# Patient Record
Sex: Male | Born: 1975 | Race: Black or African American | Hispanic: No | Marital: Married | Smoking: Never smoker
Health system: Southern US, Community
[De-identification: ages and names within clinical notes are randomized; demographics above are authoritative.]

## PROBLEM LIST (undated history)

## (undated) DIAGNOSIS — W19XXXA Unspecified fall, initial encounter: Secondary | ICD-10-CM

## (undated) DIAGNOSIS — S2239XA Fracture of one rib, unspecified side, initial encounter for closed fracture: Secondary | ICD-10-CM

---

## 2006-08-26 DIAGNOSIS — S2239XA Fracture of one rib, unspecified side, initial encounter for closed fracture: Secondary | ICD-10-CM

## 2006-08-26 DIAGNOSIS — W19XXXA Unspecified fall, initial encounter: Secondary | ICD-10-CM

## 2006-08-26 HISTORY — DX: Fracture of one rib, unspecified side, initial encounter for closed fracture: S22.39XA

## 2006-08-26 HISTORY — DX: Unspecified fall, initial encounter: W19.XXXA

## 2006-09-27 ENCOUNTER — Emergency Department (HOSPITAL_COMMUNITY): Admission: EM | Admit: 2006-09-27 | Discharge: 2006-09-27 | Payer: Self-pay

## 2007-04-22 ENCOUNTER — Ambulatory Visit (HOSPITAL_COMMUNITY): Payer: Self-pay | Admitting: Psychiatry

## 2007-11-27 ENCOUNTER — Emergency Department (HOSPITAL_COMMUNITY): Admission: EM | Admit: 2007-11-27 | Discharge: 2007-11-28 | Payer: Self-pay | Admitting: Emergency Medicine

## 2008-02-29 ENCOUNTER — Emergency Department (HOSPITAL_COMMUNITY): Admission: EM | Admit: 2008-02-29 | Discharge: 2008-02-29 | Payer: Self-pay | Admitting: Emergency Medicine

## 2008-04-29 ENCOUNTER — Encounter: Admission: RE | Admit: 2008-04-29 | Discharge: 2008-07-28 | Payer: Self-pay | Admitting: Anesthesiology

## 2008-05-03 ENCOUNTER — Ambulatory Visit: Payer: Self-pay | Admitting: Anesthesiology

## 2008-05-17 ENCOUNTER — Ambulatory Visit: Payer: Self-pay | Admitting: Anesthesiology

## 2008-05-30 ENCOUNTER — Ambulatory Visit (HOSPITAL_COMMUNITY): Admission: RE | Admit: 2008-05-30 | Discharge: 2008-05-30 | Payer: Self-pay | Admitting: Family Medicine

## 2008-07-12 ENCOUNTER — Ambulatory Visit: Payer: Self-pay | Admitting: Anesthesiology

## 2008-08-08 ENCOUNTER — Encounter: Admission: RE | Admit: 2008-08-08 | Discharge: 2008-09-06 | Payer: Self-pay | Admitting: Anesthesiology

## 2008-08-09 ENCOUNTER — Ambulatory Visit: Payer: Self-pay | Admitting: Anesthesiology

## 2008-09-06 ENCOUNTER — Ambulatory Visit: Payer: Self-pay | Admitting: Anesthesiology

## 2011-01-08 NOTE — Procedures (Signed)
[name], [name]                 ACCOUNT NO.:  [id number]   MEDICAL RECORD NO.:  [id number]          PATIENT TYPE:  REC   LOCATION:  [facility]                          FACILITY:  [facility]   PHYSICIAN:  Celene Kras, MD        DATE OF BIRTH:  November 20, 1975   DATE OF PROCEDURE:  DATE OF DISCHARGE:                               OPERATIVE REPORT   B[facility] [name] [facility].  I evaluated and  reviewed the Health and History form and 14-point review of systems.   1. Bryan [name] has a mixed spondylitic SI pain, and I suspected      contributory innervation from the 5 and 4 facet, at the medial      branch.  It is reasonable to block the SI joint and the facet, see      how he does.  Contributory elements from 3 will also be addressed.  2. This will be to the right side.  We will assess within the context      of activities of daily living.  We will consider this a 2-region or      2-separate entities of the SI and facet medial branch, but both I      think would be amenable to RF.  On exam, historical features      suggested  pain generators are supported at both levels.  I do not      necessarily plan another intervention.  I want to see how he does.      Risks, complications, and options are fully outlined.   Objectively, diffuse paralumbar myofascial, Fortin test positive,  modified gains using Patrick positive, particularly right greater than  left.  Pain with extension.  He has range of motion impaired secondary  to pain, spondylitic, sacral joint in nature.   IMPRESSION:  Sacral joint pain facet syndrome.   PLAN:  1. Sacral joint intervention, inferior medial margin 5, 4, 3 facet      right side, under local anesthetic, and he is consented.  Follow up      with him in 1 month and determine further course of care.  2. I recommend weight control, home based therapy.  He states he is      joining a gym that should be encouraged.  3. Follow expectantly.  I have used  models and discussed in lay terms.      He has consented.   The patient was taken to the fluoroscopy suite and was placed on prone  position.  The back prepped and draped in the usual fashion.  Using a 22-  gauge spinal needle, advanced the inferior medial margin of the right SI  joint and injected 1.5% mL lidocaine or Marcaine 0.5% MPF, 10 mg of  Aristocort.   Taking care, I then addressed the facet at the medial branch of 5, 4,  and 3, independent needle access points, right side, under local  anesthetic and confirmed placement.  I then injected 1.5 % Marcaine, 1%  MPF at each level, with a total of 30  mg Aristocort in divided dose.   He tolerated the procedure well.  No complications from our procedure.  Appropriate recovery.  Discharge instructions given.  No barrier to  communication and he was improved at discharge and we will see how he  does in a month.           ______________________________  Celene Kras, MD     HH/MEDQ  D:  07/12/2008 11:52:03  T:  07/13/2008 01:10:46  Job:  [id number]

## 2011-01-08 NOTE — Assessment & Plan Note (Signed)
B[name].  I evaluated him  and reviewed the health and history form and 14-point review of systems.   1. Bryan [name] comes to [place] today.  He has a follow up to the facet      intervention, and I discussed treatment limitations and options.      He continues to exercise good at the gym, I encouraged his weight      control for best outcome.  2. He catastrophizes, and has quite a bit of negative discussion as to      seeing himself as having a number of medical issues.  I reassured      him and continued to enable him as I do think he will do well, age      is certainly on a side.  He has spondylitic pain, and I think  that      will respond to RF as he has met bench marks, with greater than 75-      85% relief cycling.  3. Again, I encouraged him as the best outcome.  He is doing what he      can here, I anticipate he will do well.  He certainly is not moving      towards the disabling position, and I do think RF will help enable      him.  4. I do not see any clear depressive indices, but we will follow that      as well.  5. We will be emphasizing nonnarcotic medications alternatives.  Do      not believe further imaging or diagnostics are warranted.   Objectively, diffuse paralumbar myofascial discomfort.  Fortin test  positive.  Side bending.  Pain with extension.  Range of motion impaired  secondary to pain, typical spondylotic pain, mostly myofascial and  mechanical.  Nothing new neurologically.   IMPRESSION:  Spondylolysis and degenerative spine disease, lumbar spine.   PLAN:  As outlined above, RF and followup.  I have used models to  discuss in lay terms.  I have discussed treatment limitations, options,  risks, and expectations.           ______________________________  Celene Kras, MD     HH/MedQ  D:  08/09/2008 11:05:13  T:  08/10/2008 05:35:08  Job #:  [id number]

## 2011-01-08 NOTE — Procedures (Signed)
NAMETIBERIUS, LOFTUS                 ACCOUNT NO.:  [id number]   MEDICAL RECORD NO.:  [id number]          PATIENT TYPE:  OUT   LOCATION:  [facility]                          FACILITY:  [facility]   PHYSICIAN:  Celene Kras, MD        DATE OF BIRTH:  09-14-1975   DATE OF PROCEDURE:  DATE OF DISCHARGE:  05/30/2008                               OPERATIVE REPORT   Emidio Warrell comes into [facility] today.  I evaluated  her, reviewed history, and performed 14-point review of systems.  Some  benefit from intervention, we    DICTATION CANCELED.           ______________________________  Celene Kras, MD     HH/MEDQ  D:  07/12/2008 12:57:17  T:  07/13/2008 05:00:49  Job:  [id number]

## 2011-01-08 NOTE — Procedures (Signed)
Bryan [name], Bryan [name]                 ACCOUNT NO.:  [id number]   MEDICAL RECORD NO.:  [id number]          PATIENT TYPE:  REC   LOCATION:  [facility]                          FACILITY:  [facility]   PHYSICIAN:  Celene Kras, MD        DATE OF BIRTH:  May 19, 1976   DATE OF PROCEDURE:  05/17/2008  DATE OF DISCHARGE:                               OPERATIVE REPORT   Adelard Sanon comes to [facility] today for scheduled facet  intervention.  1. Considered adding the SI, but I am going to isolate the facet as RF      could be an option for [place].  I discussed benchmark, I consider      second intervention if warranted.  We noted the response of the SI,      I think the facet as contributory as the spondylitic pain.  I      planned 5, 4, 3, and 2 with contributory innervation addressed.      Questions are answered and discussed in lay terms.  2. Consider second sequential intervention, looking for benchmark of      50-75%.  3. Right side most problematic side.  I do not think we need to go to      the left.  He is consented for today's procedure.   The patient was taken to fluoroscopy suite, placed in prone position,  the back prepped and draped in usual fashion.   Objectively, exam interval not significantly changed.  He has diffused  paralumbar myofascial, Fortin test positive.  Pain with extension.  Nothing new neurological.   IMPRESSION:  Spondylosis, __________  lumbar spine, sacral joint pain.   PLAN:  Facet medial branch intervention 5, 4, 3, and 2, he has  consented.   The patient taken to fluoroscopy suite and placed in the prone position,  back prepped and draped in usual fashion.  Using a 22-gauge spinal  needle, I advanced the facet at the medial branch 5, 4, 3, 2, right side  independent needle access points under local anesthetic.  Confirmed  placement.  I then injected 1 mL of lidocaine, 1% MPF each level with a  total of 40 mg of Aristocort in divided doses.   Tolerated  procedure well.  No complications from our procedure.  See him  in followup.           ______________________________  Celene Kras, MD     HH/MEDQ  D:  05/17/2008 14:50:34  T:  05/18/2008 02:49:29  Job:  [id number]

## 2011-01-08 NOTE — Procedures (Signed)
NAMEINDIE, NICKERSON                 ACCOUNT NO.:  [id number]   MEDICAL RECORD NO.:  [id number]          PATIENT TYPE:  REC   LOCATION:  [facility]                          FACILITY:  [facility]   PHYSICIAN:  Celene Kras, MD        DATE OF BIRTH:  11/08/1975   DATE OF PROCEDURE:  DATE OF DISCHARGE:                               OPERATIVE REPORT   Gavyn [facility].  I evaluated and  reviewed the Health and History form and 14-point review of systems.   1. He has demonstrated a positive predictive experience and we are      going to go onto RF, after exhibiting benchmark response,      sequential intervention, describing most problematic facets S5 and      S4.  This is a right side.  He does have some SI overlay, possibly      some of the superior segments could be problematic, but I think      going I am going to focus on 5 and 4 today, with 3 as contributory      innervation.  2. Other modifiable features in health profile discussed.  Home based      therapy.  3. Weight control.  Reviewed this procedure.  I have discussed      treatment limitations and options, expectations, and reviewed with      him.   Objectively, no significant interval change, diffuse paralumbar  myofascial discomfort.  Fortin test is positive with side bending.  Pain  with extension.  Nothing new neurologically.   IMPRESSION:  Degenerative spine disease, lumbar spine with spondylosis,  and minimal myelopathy.   PLAN:  Facet medial branch intervention, radiofrequency neural ablation,  10-mm active tip and he is consented.  5, 4, and 3 right side.   The patient was taken to the fluoroscopy suite and placed in prone  position.  The back was prepped and draped in the usual fashion.  Using  a 22-gauge, 10-mm active tip needle, we advanced the facet at the medial  branch at 5, 4, and 3, confirmed placement in multiple fluoroscopic  positions.  Stimulate, follow with confirmation, 1 mL of  lidocaine,  Marcaine 0.25% MPF and 40 mg Aristocort in divided dose, to the facet.  Lesioning performed at 60 degrees for 60 seconds.  He tolerated the  procedure well.  No complications from our procedure.  Appropriate  recovery.  Improved at discharge.  Assessed in context of activities of  daily living.  Follow up in 1 month.           ______________________________  Celene Kras, MD     HH/MEDQ  D:  09/06/2008 09:35:45  T:  09/07/2008 00:18:35  Job:  [id number]

## 2011-01-08 NOTE — Procedures (Signed)
Bryan [name], Bryan [name]                 ACCOUNT NO.:  [id number]   MEDICAL RECORD NO.:  [id number]           PATIENT TYPE:   LOCATION:                                 FACILITY:   PHYSICIAN:  Celene Kras, MD             DATE OF BIRTH:   DATE OF PROCEDURE:  DATE OF DISCHARGE:                               OPERATIVE REPORT   [facility] for evaluation  and management, referred to [place] from Dr. Lavada Mesi.  A pleasant  individual, 35 years old, he has his young daughter with him.  He fell,  this was off a ladder sustaining injury to his low back, with some right  leg pain.  He is describing more of a radicular pain, some spondylitic  pain is evident, and according to the record Dr. Murray Hodgkins had considered  going forward with SI joint intervention.  That intervention would have  consisted of RF as he apparently has demonstrated about 50% diminution  of pain perception after block, but states incomplete down the leg.  He  has also been followed by primary care for hypertension within the  group, and he is seen at [facility] where he is taking  Percocet, and he wants to get off that.  Relating his pain is 7/10 on a  subjective scale.  No bowel or bladder disorder, overall neurological  deficit, worse in the morning.  Walking, bending, sitting, standing, and  some activities make it worse, improved at the rest, heat, medication,  and injections.  He believes that the pain is sometimes aggravated to  the point where he has to take to the bed, and I counsel.  He is able to  walk without assistance, and he has a good endurance potential.  He is  not working, has not worked since September 26, 2006, secondary to pain.  He has difficulty with meal prep household activities.   Nonsmoker, nondrinker, denies chemical or alcohol dependency.  He does  have some modest situational anxiety, but states no wish to harm himself  or others.   PAST MEDICAL  HISTORY:  Remarkable for hypertension, GERD.   FAMILY HISTORY:  Remarkable for heart disease, lung disease, diabetes,  hypertension.   He is married, he has a young daughter.   Review of systems, family and social history are otherwise  noncontributory to the pain problem.   PHYSICAL EXAMINATION:  GENERAL:  A pleasant, obese male sitting  comfortably in bed.  Gait:  Slight limp.  Affect, appearance normal,  oriented x3.  HEENT:  Otherwise unremarkable.  CHEST:  Clear to auscultation and percussion with increased AP diameter.  A regular rate and rhythm without rub, murmur, or gallop.  ABDOMEN:  Soft, nontender, benign, obese without evidence of  hepatosplenomegaly, diffuse paralumbar myofascial discomfort, Fortin  test positive, modified [place] and Patrick positive, right and left,  right predominant.  Pain with extension has aggravated his infragluteal  pain, and a straight leg lift is unimpaired.  EHL intact.  IMPRESSION:  Workman's comp injury with a spondylitic pain sacral joint  pain, modest radicular pain, and uncharacteristic radicular pattern.   PLAN:  1. I would recommend possibly addressing the facet SI, with RF as an      option.  He states he has demonstrated a positive predictive      experience, but only had the SI block, and I am going to need to      get some more records as they are incomplete.  We will look into      that.  2. He wants to get off his pain med, and this is another rationale for      performing intervention intervention, positive predictive      experience, the least consideration radiofrequency neural ablation,      but he will need to hit benchmarks so that will be about 75-80%.      Improvement in function, range of motion, and decreased medication      usage parameters or other benchmark.  3. Weight control for best outcome.  4. Recommend conditioning.  I do not want him to adopt a sedentary or      discouraged lifestyle.  He has some  situational depression that may      need to be treated as well.  The record reflects that Effexor was      recommended and he does not list that medicine.  I asked him to      maintain contact with primary care in this regard.  Questions were      answered, discussed in lay terms.  We will see him in followup.            ______________________________  Celene Kras, MD     HH/MEDQ  D:  05/03/2008 12:12:26  T:  05/04/2008 01:14:09  Job:  [id number]   cc:   Lillia Carmel, M.D.  Fax: [phone]

## 2011-05-21 LAB — CBC
Hemoglobin: 14.9
MCV: 87.9
Platelets: 344
RBC: 4.78
RDW: 13.3

## 2011-05-21 LAB — BASIC METABOLIC PANEL
BUN: 11
CO2: 29
Chloride: 101
GFR calc non Af Amer: >60
Glucose, Bld: 91
Potassium: 4

## 2011-05-21 LAB — URINALYSIS, ROUTINE W REFLEX MICROSCOPIC
Protein, ur: NEGATIVE
Specific Gravity, Urine: 1.027
pH: 6.5

## 2011-05-21 LAB — DIFFERENTIAL
Basophils Absolute: 0
Basophils Relative: 0
Eosinophils Absolute: 0.2
Eosinophils Relative: 2
Lymphs Abs: 3.3
Monocytes Absolute: 0.8
Monocytes Relative: 7
Neutro Abs: 7.3
Neutrophils Relative %: 63

## 2012-01-17 ENCOUNTER — Encounter: Payer: Self-pay | Admitting: Family Medicine

## 2012-01-17 ENCOUNTER — Ambulatory Visit: Payer: Self-pay | Admitting: Family Medicine

## 2012-01-17 DIAGNOSIS — Z0289 Encounter for other administrative examinations: Secondary | ICD-10-CM

## 2012-01-17 DIAGNOSIS — Z0000 Encounter for general adult medical examination without abnormal findings: Secondary | ICD-10-CM
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
## 2013-01-15 ENCOUNTER — Ambulatory Visit: Payer: Self-pay | Admitting: Internal Medicine

## 2013-01-15 VITALS — BP 124/76 | HR 98 | Temp 98.5°F | Resp 17 | Ht 64.5 in | Wt 208.0 lb

## 2013-01-15 DIAGNOSIS — Z0289 Encounter for other administrative examinations: Secondary | ICD-10-CM

## 2013-01-15 NOTE — Progress Notes (Signed)
  Subjective:    Patient ID: Bryan [name], male    DOB: [birth year], 37 y.o.   MRN: [id number]  HPI Healthy, no problems, on no meds   Review of Systems  Constitutional: Negative.   HENT: Negative.   Eyes: Negative.   Respiratory: Negative.   Cardiovascular: Negative.   Gastrointestinal: Negative.   Endocrine: Negative.   Genitourinary: Negative.   Allergic/Immunologic: Negative.   Neurological: Negative.   Hematological: Negative.   Psychiatric/Behavioral: Negative.        Objective:   Physical Exam  Vitals reviewed. Constitutional: He is oriented to person, place, and time. He appears well-developed and well-nourished.  HENT:  Right Ear: External ear normal.  Left Ear: External ear normal.  Nose: Nose normal.  Mouth/Throat: Oropharynx is clear and moist.  Eyes: Conjunctivae are normal. Pupils are equal, round, and reactive to light.  Neck: Normal range of motion. Neck supple. No thyromegaly present.  Cardiovascular: Normal rate, regular rhythm and normal heart sounds.   Pulmonary/Chest: Effort normal and breath sounds normal.  Abdominal: Soft. Bowel sounds are normal. There is no tenderness.  Genitourinary: Penis normal.  Musculoskeletal: Normal range of motion. He exhibits no edema.  Lymphadenopathy:    He has no cervical adenopathy.  Neurological: He is alert and oriented to person, place, and time. He has normal reflexes. No cranial nerve deficit. He exhibits normal muscle tone. Coordination normal.  Skin: Skin is warm. No rash noted.  Psychiatric: He has a normal mood and affect. His behavior is normal. Thought content normal.          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

## 2013-03-13 ENCOUNTER — Encounter: Payer: Self-pay | Admitting: Internal Medicine

## 2016-01-08 ENCOUNTER — Encounter (HOSPITAL_COMMUNITY): Payer: Self-pay | Admitting: Family Medicine

## 2016-01-08 ENCOUNTER — Emergency Department (HOSPITAL_COMMUNITY)
Admission: EM | Admit: 2016-01-08 | Discharge: 2016-01-08 | Disposition: A | Discharge status: Home / Self Care | Payer: 59 | Attending: Emergency Medicine | Admitting: Emergency Medicine

## 2016-01-08 ENCOUNTER — Emergency Department (HOSPITAL_COMMUNITY): Payer: 59

## 2016-01-08 DIAGNOSIS — S199XXA Unspecified injury of neck, initial encounter: Secondary | ICD-10-CM | POA: Insufficient documentation

## 2016-01-08 DIAGNOSIS — S0990XA Unspecified injury of head, initial encounter: Secondary | ICD-10-CM | POA: Insufficient documentation

## 2016-01-08 DIAGNOSIS — Y92410 Unspecified street and highway as the place of occurrence of the external cause: Secondary | ICD-10-CM | POA: Diagnosis not present

## 2016-01-08 DIAGNOSIS — Y998 Other external cause status: Secondary | ICD-10-CM | POA: Diagnosis not present

## 2016-01-08 DIAGNOSIS — Y9389 Activity, other specified: Secondary | ICD-10-CM | POA: Insufficient documentation

## 2016-01-08 HISTORY — DX: Unspecified fall, initial encounter: W19.XXXA

## 2016-01-08 HISTORY — DX: Fracture of one rib, unspecified side, initial encounter for closed fracture: S22.39XA

## 2016-01-08 LAB — URINALYSIS, ROUTINE W REFLEX MICROSCOPIC
BILIRUBIN URINE: NEGATIVE
GLUCOSE, UA: NEGATIVE mg/dL
HGB URINE DIPSTICK: NEGATIVE
Ketones, ur: NEGATIVE mg/dL
Leukocytes, UA: NEGATIVE
Nitrite: NEGATIVE
PROTEIN: NEGATIVE mg/dL
Specific Gravity, Urine: 1.008 (ref 1.005–1.030)
pH: 7 (ref 5.0–8.0)

## 2016-01-08 LAB — CBC WITH DIFFERENTIAL/PLATELET
BASOS ABS: 0 10*3/uL (ref 0.0–0.1)
BASOS PCT: 0 %
Eosinophils Absolute: 0.1 10*3/uL (ref 0.0–0.7)
Eosinophils Relative: 1 %
HEMATOCRIT: 43.9 % (ref 39.0–52.0)
HEMOGLOBIN: 15.3 g/dL (ref 13.0–17.0)
Lymphocytes Relative: 19 %
Lymphs Abs: 1.7 10*3/uL (ref 0.7–4.0)
MCH: 31.9 pg (ref 26.0–34.0)
MCHC: 34.9 g/dL (ref 30.0–36.0)
MCV: 91.6 fL (ref 78.0–100.0)
MONOS PCT: 8 %
Monocytes Absolute: 0.7 10*3/uL (ref 0.1–1.0)
NEUTROS PCT: 72 %
Neutro Abs: 6.1 10*3/uL (ref 1.7–7.7)
Platelets: 279 10*3/uL (ref 150–400)
RBC: 4.79 MIL/uL (ref 4.22–5.81)
RDW: 12.5 % (ref 11.5–15.5)
WBC: 8.5 10*3/uL (ref 4.0–10.5)

## 2016-01-08 LAB — RAPID URINE DRUG SCREEN, HOSP PERFORMED
AMPHETAMINES: NOT DETECTED
BARBITURATES: NOT DETECTED
BENZODIAZEPINES: NOT DETECTED
COCAINE: NOT DETECTED
Opiates: NOT DETECTED
TETRAHYDROCANNABINOL: NOT DETECTED

## 2016-01-08 LAB — ETHANOL: Alcohol, Ethyl (B): <5 mg/dL (ref <5)

## 2016-01-08 LAB — BASIC METABOLIC PANEL
ANION GAP: 10 (ref 5–15)
BUN: 7 mg/dL (ref 6–20)
CALCIUM: 9.2 mg/dL (ref 8.9–10.3)
CO2: 22 mmol/L (ref 22–32)
Chloride: 106 mmol/L (ref 101–111)
Creatinine, Ser: 0.92 mg/dL (ref 0.61–1.24)
GFR calc Af Amer: >60 mL/min (ref >60)
GLUCOSE: 97 mg/dL (ref 65–99)
Potassium: 3.8 mmol/L (ref 3.5–5.1)
Sodium: 138 mmol/L (ref 135–145)
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
## 2016-01-08 NOTE — Discharge Instructions (Signed)
Concussion, Adult °A concussion, or closed-head injury, is a brain injury caused by a direct blow to the head or by a quick and sudden movement (jolt) of the head or neck. Concussions are usually not life-threatening. Even so, the effects of a concussion can be serious. If you have had a concussion before, you are more likely to experience concussion-like symptoms after a direct blow to the head.  °CAUSES °· Direct blow to the head, such as from running into another player during a soccer game, being hit in a fight, or hitting your head on a hard surface. °· A jolt of the head or neck that causes the brain to move back and forth inside the skull, such as in a car crash. °SIGNS AND SYMPTOMS °The signs of a concussion can be hard to notice. Early on, they may be missed by you, family members, and health care providers. You may look fine but act or feel differently. °Symptoms are usually temporary, but they may last for days, weeks, or even longer. Some symptoms may appear right away while others may not show up for hours or days. Every head injury is different. Symptoms include: °· Mild to moderate headaches that will not go away. °· A feeling of pressure inside your head. °· Having more trouble than usual: °· Learning or remembering things you have heard. °· Answering questions. °· Paying attention or concentrating. °· Organizing daily tasks. °· Making decisions and solving problems. °· Slowness in thinking, acting or reacting, speaking, or reading. °· Getting lost or being easily confused. °· Feeling tired all the time or lacking energy (fatigued). °· Feeling drowsy. °· Sleep disturbances. °· Sleeping more than usual. °· Sleeping less than usual. °· Trouble falling asleep. °· Trouble sleeping (insomnia). °· Loss of balance or feeling lightheaded or dizzy. °· Nausea or vomiting. °· Numbness or tingling. °· Increased sensitivity to: °· Sounds. °· Lights. °· Distractions. °· Vision problems or eyes that tire  easily. °· Diminished sense of taste or smell. °· Ringing in the ears. °· Mood changes such as feeling sad or anxious. °· Becoming easily irritated or angry for little or no reason. °· Lack of motivation. °· Seeing or hearing things other people do not see or hear (hallucinations). °DIAGNOSIS °Your health care provider can usually diagnose a concussion based on a description of your injury and symptoms. He or she will ask whether you passed out (lost consciousness) and whether you are having trouble remembering events that happened right before and during your injury. °Your evaluation might include: °· A brain scan to look for signs of injury to the brain. Even if the test shows no injury, you may still have a concussion. °· Blood tests to be sure other problems are not present. °TREATMENT °· Concussions are usually treated in an emergency department, in urgent care, or at a clinic. You may need to stay in the hospital overnight for further treatment. °· Tell your health care provider if you are taking any medicines, including prescription medicines, over-the-counter medicines, and natural remedies. Some medicines, such as blood thinners (anticoagulants) and aspirin, may increase the chance of complications. Also tell your health care provider whether you have had alcohol or are taking illegal drugs. This information may affect treatment. °· Your health care provider will send you home with important instructions to follow. °· How fast you will recover from a concussion depends on many factors. These factors include how severe your concussion is, what part of your brain was injured,   your age, and how healthy you were before the concussion. °· Most people with mild injuries recover fully. Recovery can take time. In general, recovery is slower in older persons. Also, persons who have had a concussion in the past or have other medical problems may find that it takes longer to recover from their current injury. °HOME  CARE INSTRUCTIONS °General Instructions °· Carefully follow the directions your health care provider gave you. °· Only take over-the-counter or prescription medicines for pain, discomfort, or fever as directed by your health care provider. °· Take only those medicines that your health care provider has approved. °· Do not drink alcohol until your health care provider says you are well enough to do so. Alcohol and certain other drugs may slow your recovery and can put you at risk of further injury. °· If it is harder than usual to remember things, write them down. °· If you are easily distracted, try to do one thing at a time. For example, do not try to watch TV while fixing dinner. °· Talk with family members or close friends when making important decisions. °· Keep all follow-up appointments. Repeated evaluation of your symptoms is recommended for your recovery. °· Watch your symptoms and tell others to do the same. Complications sometimes occur after a concussion. Older adults with a brain injury may have a higher risk of serious complications, such as a blood clot on the brain. °· Tell your teachers, school nurse, school counselor, coach, athletic trainer, or work manager about your injury, symptoms, and restrictions. Tell them about what you can or cannot do. They should watch for: °¨ Increased problems with attention or concentration. °¨ Increased difficulty remembering or learning new information. °¨ Increased time needed to complete tasks or assignments. °¨ Increased irritability or decreased ability to cope with stress. °¨ Increased symptoms. °· Rest. Rest helps the brain to heal. Make sure you: °¨ Get plenty of sleep at night. Avoid staying up late at night. °¨ Keep the same bedtime hours on weekends and weekdays. °¨ Rest during the day. Take daytime naps or rest breaks when you feel tired. °· Limit activities that require a lot of thought or concentration. These include: °¨ Doing homework or job-related  work. °¨ Watching TV. °¨ Working on the computer. °· Avoid any situation where there is potential for another head injury (football, hockey, soccer, basketball, martial arts, downhill snow sports and horseback riding). Your condition will get worse every time you experience a concussion. You should avoid these activities until you are evaluated by the appropriate follow-up health care providers. °Returning To Your Regular Activities °You will need to return to your normal activities slowly, not all at once. You must give your body and brain enough time for recovery. °· Do not return to sports or other athletic activities until your health care provider tells you it is safe to do so. °· Ask your health care provider when you can drive, ride a bicycle, or operate heavy machinery. Your ability to react may be slower after a brain injury. Never do these activities if you are dizzy. °· Ask your health care provider about when you can return to work or school. °Preventing Another Concussion °It is very important to avoid another brain injury, especially before you have recovered. In rare cases, another injury can lead to permanent brain damage, brain swelling, or death. The risk of this is greatest during the first 7-10 days after a head injury. Avoid injuries by: °· Wearing a   seat belt when riding in a car. °· Drinking alcohol only in moderation. °· Wearing a helmet when biking, skiing, skateboarding, skating, or doing similar activities. °· Avoiding activities that could lead to a second concussion, such as contact or recreational sports, until your health care provider says it is okay. °· Taking safety measures in your home. °¨ Remove clutter and tripping hazards from floors and stairways. °¨ Use grab bars in bathrooms and handrails by stairs. °¨ Place non-slip mats on floors and in bathtubs. °¨ Improve lighting in dim areas. °SEEK MEDICAL CARE IF: °· You have increased problems paying attention or  concentrating. °· You have increased difficulty remembering or learning new information. °· You need more time to complete tasks or assignments than before. °· You have increased irritability or decreased ability to cope with stress. °· You have more symptoms than before. °Seek medical care if you have any of the following symptoms for more than 2 weeks after your injury: °· Lasting (chronic) headaches. °· Dizziness or balance problems. °· Nausea. °· Vision problems. °· Increased sensitivity to noise or light. °· Depression or mood swings. °· Anxiety or irritability. °· Memory problems. °· Difficulty concentrating or paying attention. °· Sleep problems. °· Feeling tired all the time. °SEEK IMMEDIATE MEDICAL CARE IF: °· You have severe or worsening headaches. These may be a sign of a blood clot in the brain. °· You have weakness (even if only in one hand, leg, or part of the face). °· You have numbness. °· You have decreased coordination. °· You vomit repeatedly. °· You have increased sleepiness. °· One pupil is larger than the other. °· You have convulsions. °· You have slurred speech. °· You have increased confusion. This may be a sign of a blood clot in the brain. °· You have increased restlessness, agitation, or irritability. °· You are unable to recognize people or places. °· You have neck pain. °· It is difficult to wake you up. °· You have unusual behavior changes. °· You lose consciousness. °MAKE SURE YOU: °· Understand these instructions. °· Will watch your condition. °· Will get help right away if you are not doing well or get worse. °  °This information is not intended to replace advice given to you by your health care provider. Make sure you discuss any questions you have with your health care provider. °  °Document Released: 11/02/2003 Document Revised: 09/02/2014 Document Reviewed: 03/04/2013 °Elsevier Interactive Patient Education ©2016 [employer] ° °Motor Vehicle Collision °It is common to have  multiple bruises and sore muscles after a motor vehicle collision (MVC). These tend to feel worse for the first 24 hours. You may have the most stiffness and soreness over the first several hours. You may also feel worse when you wake up the first morning after your collision. After this point, you will usually begin to improve with each day. The speed of improvement often depends on the severity of the collision, the number of injuries, and the location and nature of these injuries. °HOME CARE INSTRUCTIONS °· Put ice on the injured area. °¨ Put ice in a plastic bag. °¨ Place a towel between your skin and the bag. °¨ Leave the ice on for 15-20 minutes, 3-4 times a day, or as directed by your health care provider. °· Drink enough fluids to keep your urine clear or pale yellow. Do not drink alcohol. °· Take a warm shower or bath once or twice a day. This will increase blood flow to sore   muscles. °· You may return to activities as directed by your caregiver. Be careful when lifting, as this may aggravate neck or back pain. °· Only take over-the-counter or prescription medicines for pain, discomfort, or fever as directed by your caregiver. Do not use aspirin. This may increase bruising and bleeding. °SEEK IMMEDIATE MEDICAL CARE IF: °· You have numbness, tingling, or weakness in the arms or legs. °· You develop severe headaches not relieved with medicine. °· You have severe neck pain, especially tenderness in the middle of the back of your neck. °· You have changes in bowel or bladder control. °· There is increasing pain in any area of the body. °· You have shortness of breath, light-headedness, dizziness, or fainting. °· You have chest pain. °· You feel sick to your stomach (nauseous), throw up (vomit), or sweat. °· You have increasing abdominal discomfort. °· There is blood in your urine, stool, or vomit. °· You have pain in your shoulder (shoulder strap areas). °· You feel your symptoms are getting worse. °MAKE SURE  YOU: °· Understand these instructions. °· Will watch your condition. °· Will get help right away if you are not doing well or get worse. °  °This information is not intended to replace advice given to you by your health care provider. Make sure you discuss any questions you have with your health care provider. °  °Document Released: 08/12/2005 Document Revised: 09/02/2014 Document Reviewed: 01/09/2011 °Elsevier Interactive Patient Education ©2016 [employer] ° °
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
